# Patient Record
Sex: Male | Born: 1970 | Race: Black or African American | Hispanic: No | Marital: Single | Smoking: Current every day smoker
Health system: Southern US, Community
[De-identification: ages and names within clinical notes are randomized; demographics above are authoritative.]

---

## 2004-04-29 ENCOUNTER — Ambulatory Visit (HOSPITAL_COMMUNITY): Admission: RE | Admit: 2004-04-29 | Discharge: 2004-04-29 | Payer: Self-pay | Admitting: Family Medicine

## 2005-09-13 IMAGING — CR DG WRIST COMPLETE 3+V*L*
1 series · 1 of 1 positions shown · non-contrast
Comparison: none

CLINICAL DATA: Pain; injured falling, playing basketball; swelling and bruising
 LEFT WRIST FOUR VIEWS
 Nondisplaced scaphoid waist fracture.  Joint spaces preserved.  No additional fracture, dislocation, or bone destruction.  Mineralization normal. 
 IMPRESSION
 Nondisplaced scaphoid waist fracture.

[view not recorded]
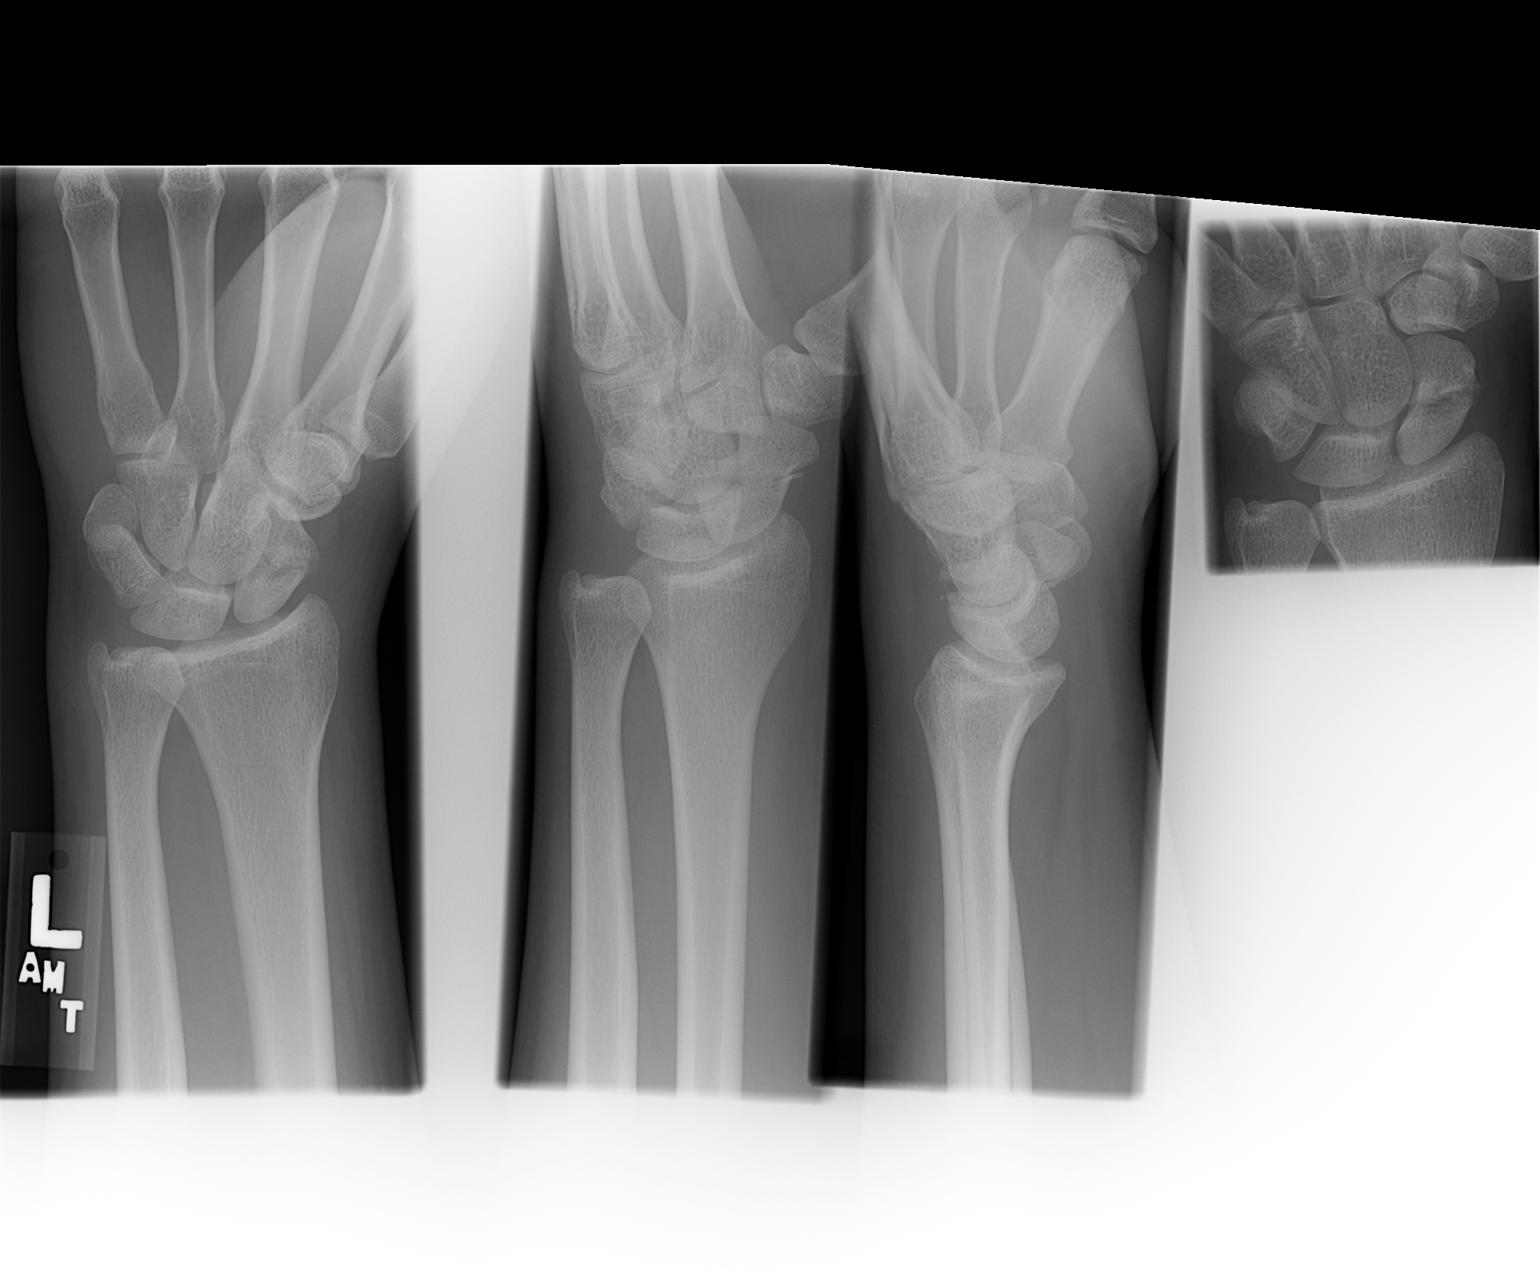

[1 of 1 positions shown; findings below may reference images not displayed]

## 2021-06-18 ENCOUNTER — Ambulatory Visit
Admission: RE | Admit: 2021-06-18 | Disposition: A | Discharge status: Home / Self Care | Payer: Self-pay | Source: Ambulatory Visit

## 2021-06-18 ENCOUNTER — Encounter: Payer: Self-pay | Admitting: Medical Oncology

## 2021-06-18 ENCOUNTER — Ambulatory Visit
Admission: EM | Admit: 2021-06-18 | Discharge: 2021-06-18 | Disposition: A | Discharge status: Home / Self Care | Payer: BC Managed Care – PPO

## 2021-06-18 ENCOUNTER — Other Ambulatory Visit: Payer: Self-pay

## 2021-06-18 DIAGNOSIS — I499 Cardiac arrhythmia, unspecified: Secondary | ICD-10-CM

## 2021-06-18 DIAGNOSIS — R197 Diarrhea, unspecified: Secondary | ICD-10-CM | POA: Diagnosis not present

## 2021-06-18 DIAGNOSIS — J069 Acute upper respiratory infection, unspecified: Secondary | ICD-10-CM

## 2021-06-18 MED ORDER — FLUTICASONE PROPIONATE 50 MCG/ACT NA SUSP: 2.0000 | Freq: Every day | NASAL | 0 refills | Status: AC

## 2021-06-18 MED ORDER — BENZONATATE 100 MG PO CAPS: 100.0000 mg | ORAL_CAPSULE | Freq: Three times a day (TID) | ORAL | 0 refills | Status: AC

## 2021-06-18 NOTE — ED Provider Notes (Addendum)
[facility]    CSN: [id number] Arrival date & time: 06/18/21  1546      History   Chief Complaint Chief Complaint  Patient presents with   Nausea   Diarrhea    HPI Timothy Harmon is a 50 y.o. male.   HPI  Cold Symptoms: Patient reports that about 9 days ago he started to have cold symptoms of runny nose, nasal congestion, mild dry cough, nausea and diarrhea.  He notes 1 episode of vomiting which appeared to be his last meal.  He states that now he has diarrhea after he eats but otherwise is feeling better in general.  Diarrhea is nonbloody.  He has tried over-the-counter cough and cold medicine which has been helpful to his symptoms.  He presents today as his symptoms have continued so long that he wants to make sure that there is nothing that he is missing.  He does report that he took a COVID test yesterday which was negative.  He denies any chest pain or shortness of breath.  A few sick contacts at work with similar symptoms.   History reviewed. No pertinent past medical history.  There are no problems to display for this patient.   History reviewed. No pertinent surgical history.    Home Medications    Prior to Admission medications   Medication Sig Start Date End Date Taking? Authorizing Provider  hydrochlorothiazide (HYDRODIURIL) 12.5 MG tablet Take by mouth.   Yes [provider]  omeprazole (PRILOSEC) 10 MG capsule Take by mouth.   Yes [provider]    Family History History reviewed. No pertinent family history.  Social History Social History   Tobacco Use   Smoking status: Every Day    Pack years: 0.00    Types: Cigarettes   Smokeless tobacco: Never  Vaping Use   Vaping Use: Never used  Substance Use Topics   Alcohol use: Yes   Drug use: Not Currently     Allergies   Patient has no allergy information on record.   Review of Systems Review of Systems  As stated above in HPI Physical Exam Triage Vital  Signs ED Triage Vitals  Enc Vitals Group     BP 06/18/21 1715 (!) 153/107     Pulse Rate 06/18/21 1715 76     Resp 06/18/21 1715 18     Temp 06/18/21 1715 98.4 F (36.9 C)     Temp Source 06/18/21 1715 Oral     SpO2 06/18/21 1715 95 %     Weight 06/18/21 1713 200 lb (90.7 kg)     Height 06/18/21 1713 6\' 1"  (1.854 m)     Head Circumference --      Peak Flow --      Pain Score 06/18/21 1713 0     Pain Loc --      Pain Edu? --      Excl. in GC? --    No data found.  Updated Vital Signs BP (!) 153/107 (BP Location: Left Arm)   Pulse 76   Temp 98.4 F (36.9 C) (Oral)   Resp 18   Ht 6\' 1"  (1.854 m)   Wt 200 lb (90.7 kg)   SpO2 95%   BMI 26.39 kg/m   Physical Exam Vitals and nursing note reviewed.  Constitutional:      General: He is not in acute distress.    Appearance: Normal appearance. He is not ill-appearing, toxic-appearing or diaphoretic.  HENT:  Head: Normocephalic and atraumatic.     Right Ear: Tympanic membrane normal.     Left Ear: Tympanic membrane normal.     Nose: Congestion and rhinorrhea present.     Mouth/Throat:     Mouth: Mucous membranes are moist.     Pharynx: Oropharynx is clear. No oropharyngeal exudate or posterior oropharyngeal erythema.     Comments: Poor dentition Eyes:     Extraocular Movements: Extraocular movements intact.     Pupils: Pupils are equal, round, and reactive to light.  Cardiovascular:     Rate and Rhythm: Normal rate. Rhythm irregular.     Heart sounds: Normal heart sounds.  Pulmonary:     Effort: Pulmonary effort is normal.     Breath sounds: Normal breath sounds.  Abdominal:     General: Abdomen is flat. Bowel sounds are normal. There is no distension.     Palpations: Abdomen is soft. There is no mass.     Tenderness: There is no abdominal tenderness. There is no right CVA tenderness, left CVA tenderness, guarding or rebound.     Hernia: No hernia is present.  Musculoskeletal:     Cervical back: Normal range of  motion and neck supple.  Lymphadenopathy:     Cervical: No cervical adenopathy.  Skin:    General: Skin is warm.  Neurological:     Mental Status: He is alert.     UC Treatments / Results  Labs (all labs ordered are listed, but only abnormal results are displayed) Labs Reviewed - No data to display  EKG   Radiology No results found.  Procedures Procedures (including critical care time)  Medications Ordered in UC Medications - No data to display  Initial Impression / Assessment and Plan / UC Course  I have reviewed the triage vital signs and the nursing notes.  Pertinent labs & imaging results that were available during my care of the patient were reviewed by me and considered in my medical decision making (see chart for details).     New.  Likely viral in nature which I discussed with patient.  We will send in medications to make him more comfortable.  I did discuss my concerns with his pulse rate and I have ordered an EKG after discussing with patient and he is agreeable.  This will assess for any potential for A. Fib which would be new for patient. UPDATE: EKG does not show sign of A.Fib. Must have been PVC palpated during physical examination.  Final Clinical Impressions(s) / UC Diagnoses   Final diagnoses:  None   Discharge Instructions   None    ED Prescriptions   None    PDMP not reviewed this encounter.   Rushie Chestnut, PA-C 06/18/21 1800    Rushie Chestnut, PA-C 06/18/21 Timothy Harmon

## 2021-06-18 NOTE — ED Triage Notes (Signed)
Pt c/o cough, nausea, diarrhea and runny nose. Started about 9 days ago. Pt states he took a home covid test and was negative. Denies fever.

## 2023-04-28 ENCOUNTER — Ambulatory Visit: Payer: Self-pay

## 2024-03-24 ENCOUNTER — Ambulatory Visit
Admission: RE | Admit: 2024-03-24 | Discharge: 2024-03-24 | Disposition: A | Discharge status: Home / Self Care | Payer: Self-pay | Source: Ambulatory Visit | Attending: Internal Medicine | Admitting: Internal Medicine

## 2024-03-24 VITALS — BP 142/96 | HR 104 | Temp 97.6°F | Resp 16

## 2024-03-24 DIAGNOSIS — W57XXXA Bitten or stung by nonvenomous insect and other nonvenomous arthropods, initial encounter: Secondary | ICD-10-CM

## 2024-03-24 DIAGNOSIS — S30861A Insect bite (nonvenomous) of abdominal wall, initial encounter: Secondary | ICD-10-CM | POA: Diagnosis not present

## 2024-03-24 MED ORDER — DOXYCYCLINE HYCLATE 100 MG PO CAPS: 100.0000 mg | ORAL_CAPSULE | Freq: Two times a day (BID) | ORAL | 0 refills | Status: AC

## 2024-03-24 NOTE — ED Provider Notes (Signed)
 [facility]    CSN: [id number] Arrival date & time: 03/24/24  1340      History   Chief Complaint Chief Complaint  Patient presents with   Tick Removal    Entered by patient    HPI Timothy Harmon is a 53 y.o. male.   Patient presents to urgent care for evaluation of tick bite to the umbilicus that he first noticed 1 to 1.5 weeks ago. He was able to pull the tick off of the skin as soon as he noticed it there and he believes he was able to remove the tick entirely. Pus began to form around the site of the removed tick 7 days ago and the area became itchy. He has been trying to avoid itching the site. Reports redness and swelling with worsening pus drainage from the site over the last few days. Denies fever, chills, N/V/D, abdominal pain, myalgias, and rash to other areas of the body. Denies history of immunosuppression, no recent antibiotics in the last 90 days. He has not attempted treatment of symptoms PTA.      History reviewed. No pertinent past medical history.  There are no active problems to display for this patient.   History reviewed. No pertinent surgical history.     Home Medications    Prior to Admission medications   Medication Sig Start Date End Date Taking? Authorizing Provider  doxycycline (VIBRAMYCIN) 100 MG capsule Take 1 capsule (100 mg total) by mouth 2 (two) times daily. 03/24/24  Yes Starlene Eaton, FNP  benzonatate (TESSALON) 100 MG capsule Take 1 capsule (100 mg total) by mouth every 8 (eight) hours. 06/18/21   Sharla Davis, PA-C  fluticasone (FLONASE) 50 MCG/ACT nasal spray Place 2 sprays into both nostrils daily. 06/18/21   Sharla Davis, PA-C  hydrochlorothiazide (HYDRODIURIL) 12.5 MG tablet Take by mouth.    [provider]  omeprazole (PRILOSEC) 10 MG capsule Take by mouth.    [provider]    Family History History reviewed. No pertinent family history.  Social History Social History    Tobacco Use   Smoking status: Every Day    Types: Cigarettes   Smokeless tobacco: Never  Vaping Use   Vaping status: Never Used  Substance Use Topics   Alcohol use: Yes   Drug use: Not Currently     Allergies   Patient has no known allergies.   Review of Systems Review of Systems Per HPI  Physical Exam Triage Vital Signs ED Triage Vitals  Encounter Vitals Group     BP 03/24/24 1343 (!) 142/96     Systolic BP Percentile --      Diastolic BP Percentile --      Pulse Rate 03/24/24 1343 (!) 104     Resp 03/24/24 1343 16     Temp 03/24/24 1343 97.6 F (36.4 C)     Temp Source 03/24/24 1343 Oral     SpO2 03/24/24 1343 93 %     Weight --      Height --      Head Circumference --      Peak Flow --      Pain Score 03/24/24 1346 0     Pain Loc --      Pain Education --      Exclude from Growth Chart --    No data found.  Updated Vital Signs BP (!) 142/96 (BP Location: Right Arm)   Pulse (!) 104  Temp 97.6 F (36.4 C) (Oral)   Resp 16   SpO2 93%   Visual Acuity Right Eye Distance:   Left Eye Distance:   Bilateral Distance:    Right Eye Near:   Left Eye Near:    Bilateral Near:     Physical Exam Vitals and nursing note reviewed.  Constitutional:      Appearance: He is not ill-appearing or toxic-appearing.  HENT:     Head: Normocephalic and atraumatic.     Right Ear: Hearing and external ear normal.     Left Ear: Hearing and external ear normal.     Nose: Nose normal.     Mouth/Throat:     Lips: Pink.  Eyes:     General: Lids are normal. Vision grossly intact. Gaze aligned appropriately.     Extraocular Movements: Extraocular movements intact.     Conjunctiva/sclera: Conjunctivae normal.  Pulmonary:     Effort: Pulmonary effort is normal.  Abdominal:     General: Abdomen is flat. Bowel sounds are normal.     Palpations: Abdomen is soft.     Tenderness: There is no abdominal tenderness.    Musculoskeletal:     Cervical back: Neck supple.   Skin:    General: Skin is warm and dry.     Capillary Refill: Capillary refill takes less than 2 seconds.     Findings: No rash.  Neurological:     General: No focal deficit present.     Mental Status: He is alert and oriented to person, place, and time. Mental status is at baseline.     Cranial Nerves: No dysarthria or facial asymmetry.  Psychiatric:        Mood and Affect: Mood normal.        Speech: Speech normal.        Behavior: Behavior normal.        Thought Content: Thought content normal.        Judgment: Judgment normal.    Abdominal wall lesion   UC Treatments / Results  Labs (all labs ordered are listed, but only abnormal results are displayed) Labs Reviewed - No data to display  EKG   Radiology No results found.  Procedures Procedures (including critical care time)  Medications Ordered in UC Medications - No data to display  Initial Impression / Assessment and Plan / UC Course  I have reviewed the triage vital signs and the nursing notes.  Pertinent labs & imaging results that were available during my care of the patient were reviewed by me and considered in my medical decision making (see chart for details).   1. Tick bite of abdomen  Doxycycline ordered to treat infected tick bite of abdomen.  Warm compresses, wound care discussed.  Counseled patient on potential for adverse effects with medications prescribed/recommended today, strict ER and return-to-clinic precautions discussed, patient verbalized understanding.    Final Clinical Impressions(s) / UC Diagnoses   Final diagnoses:  Tick bite of abdomen, initial encounter     Discharge Instructions      Take doxycycline every 12 hours for neck 7 days to treat infection due to tick bite.  Use warm compresses to the site to reduce inflammation.  Avoid scratching the area of the wound.  If you notice any new or worsening swelling, pus, pain, fever, chills, nausea, vomiting, or bodyaches,  please return to urgent care for reevaluation.  If you develop any new or worsening symptoms or if your symptoms do not start to  improve, please return here or follow-up with your primary care provider. If your symptoms are severe, please go to the emergency room.   ED Prescriptions     Medication Sig Dispense Auth. Provider   doxycycline (VIBRAMYCIN) 100 MG capsule Take 1 capsule (100 mg total) by mouth 2 (two) times daily. 20 capsule Starlene Eaton, FNP      PDMP not reviewed this encounter.   Starlene Eaton, [place] 03/24/24 1418

## 2024-03-24 NOTE — Discharge Instructions (Signed)
 Take doxycycline every 12 hours for neck 7 days to treat infection due to tick bite.  Use warm compresses to the site to reduce inflammation.  Avoid scratching the area of the wound.  If you notice any new or worsening swelling, pus, pain, fever, chills, nausea, vomiting, or bodyaches, please return to urgent care for reevaluation.  If you develop any new or worsening symptoms or if your symptoms do not start to improve, please return here or follow-up with your primary care provider. If your symptoms are severe, please go to the emergency room.

## 2024-03-24 NOTE — ED Triage Notes (Signed)
 Pt reports tick bite located in the belly button, was at work the other day the other day was able to remove the tick but now it is draining pus from the site of the bite.
